# Patient Record
Sex: Female | Born: 1970 | Race: White | Hispanic: No | Smoking: Never smoker
Health system: Southern US, Community
[De-identification: ages and names within clinical notes are randomized; demographics above are authoritative.]

## PROBLEM LIST (undated history)

## (undated) DIAGNOSIS — C539 Malignant neoplasm of cervix uteri, unspecified: Secondary | ICD-10-CM

## (undated) DIAGNOSIS — F329 Major depressive disorder, single episode, unspecified: Secondary | ICD-10-CM

## (undated) DIAGNOSIS — E079 Disorder of thyroid, unspecified: Secondary | ICD-10-CM

---

## 2017-02-13 ENCOUNTER — Emergency Department (HOSPITAL_COMMUNITY)
Admission: EM | Admit: 2017-02-13 | Discharge: 2017-02-13 | Disposition: A | Discharge status: Home / Self Care | Payer: Medicaid Other | Attending: Emergency Medicine | Admitting: Emergency Medicine

## 2017-02-13 ENCOUNTER — Emergency Department (HOSPITAL_COMMUNITY): Payer: Medicaid Other

## 2017-02-13 ENCOUNTER — Encounter (HOSPITAL_COMMUNITY): Payer: Self-pay

## 2017-02-13 DIAGNOSIS — F419 Anxiety disorder, unspecified: Secondary | ICD-10-CM | POA: Insufficient documentation

## 2017-02-13 DIAGNOSIS — F410 Panic disorder [episodic paroxysmal anxiety] without agoraphobia: Secondary | ICD-10-CM | POA: Diagnosis present

## 2017-02-13 DIAGNOSIS — R0789 Other chest pain: Secondary | ICD-10-CM | POA: Diagnosis not present

## 2017-02-13 DIAGNOSIS — Z8541 Personal history of malignant neoplasm of cervix uteri: Secondary | ICD-10-CM | POA: Diagnosis not present

## 2017-02-13 DIAGNOSIS — R079 Chest pain, unspecified: Secondary | ICD-10-CM

## 2017-02-13 HISTORY — DX: Major depressive disorder, single episode, unspecified: F32.9

## 2017-02-13 HISTORY — DX: Malignant neoplasm of cervix uteri, unspecified: C53.9

## 2017-02-13 HISTORY — DX: Disorder of thyroid, unspecified: E07.9

## 2017-02-13 LAB — URINALYSIS, ROUTINE W REFLEX MICROSCOPIC
Bilirubin Urine: NEGATIVE
Glucose, UA: 150 mg/dL — AB
Hgb urine dipstick: NEGATIVE
KETONES UR: 80 mg/dL — AB
LEUKOCYTES UA: NEGATIVE
NITRITE: NEGATIVE
PH: 5 (ref 5.0–8.0)
Protein, ur: NEGATIVE mg/dL
SPECIFIC GRAVITY, URINE: 1.017 (ref 1.005–1.030)

## 2017-02-13 LAB — BASIC METABOLIC PANEL
ANION GAP: 24 — AB (ref 5–15)
ANION GAP: 14 (ref 5–15)
BUN: 6 mg/dL (ref 6–20)
BUN: 5 mg/dL — ABNORMAL LOW (ref 6–20)
CHLORIDE: 104 mmol/L (ref 101–111)
CO2: 13 mmol/L — ABNORMAL LOW (ref 22–32)
CO2: 18 mmol/L — AB (ref 22–32)
Calcium: 8.6 mg/dL — ABNORMAL LOW (ref 8.9–10.3)
Calcium: 7.4 mg/dL — ABNORMAL LOW (ref 8.9–10.3)
Chloride: 106 mmol/L (ref 101–111)
Creatinine, Ser: 0.79 mg/dL (ref 0.44–1.00)
Creatinine, Ser: 0.71 mg/dL (ref 0.44–1.00)
GFR calc Af Amer: >60 mL/min (ref >60)
GFR calc non Af Amer: >60 mL/min (ref >60)
GLUCOSE: 89 mg/dL (ref 65–99)
Glucose, Bld: 170 mg/dL — ABNORMAL HIGH (ref 65–99)
Potassium: 4.4 mmol/L (ref 3.5–5.1)
Potassium: 3.9 mmol/L (ref 3.5–5.1)
SODIUM: 138 mmol/L (ref 135–145)
Sodium: 141 mmol/L (ref 135–145)

## 2017-02-13 LAB — CBC WITH DIFFERENTIAL/PLATELET
BASOS ABS: 0 10*3/uL (ref 0.0–0.1)
Basophils Relative: 0 %
Eosinophils Absolute: 0 10*3/uL (ref 0.0–0.7)
Eosinophils Relative: 0 %
HCT: 38.5 % (ref 36.0–46.0)
HEMOGLOBIN: 12.6 g/dL (ref 12.0–15.0)
LYMPHS PCT: 9 %
Lymphs Abs: 0.7 10*3/uL (ref 0.7–4.0)
MCH: 35.4 pg — AB (ref 26.0–34.0)
MCHC: 32.7 g/dL (ref 30.0–36.0)
MCV: 108.1 fL — ABNORMAL HIGH (ref 78.0–100.0)
Monocytes Absolute: 0.1 10*3/uL (ref 0.1–1.0)
Monocytes Relative: 2 %
NEUTROS ABS: 6.8 10*3/uL (ref 1.7–7.7)
NEUTROS PCT: 89 %
Platelets: 150 10*3/uL (ref 150–400)
RBC: 3.56 MIL/uL — AB (ref 3.87–5.11)
RDW: 14.5 % (ref 11.5–15.5)
WBC: 7.7 10*3/uL (ref 4.0–10.5)

## 2017-02-13 LAB — I-STAT TROPONIN, ED: TROPONIN I, POC: 0 ng/mL (ref 0.00–0.08)

## 2017-02-13 MED ORDER — SODIUM CHLORIDE 0.9 % IV BOLUS (SEPSIS)
1000.0000 mL | Freq: Once | INTRAVENOUS | Status: AC
  Administered 2017-02-13: 1000 mL via INTRAVENOUS

## 2017-02-13 MED ORDER — LORAZEPAM 2 MG/ML IJ SOLN
0.5000 mg | Freq: Once | INTRAMUSCULAR | Status: AC
  Administered 2017-02-13: 0.5 mg via INTRAVENOUS
  Filled 2017-02-13: qty 1

## 2017-02-13 MED ORDER — ACETAMINOPHEN 325 MG PO TABS
650.0000 mg | ORAL_TABLET | Freq: Once | ORAL | Status: AC
  Administered 2017-02-13: 650 mg via ORAL
  Filled 2017-02-13: qty 2

## 2017-02-13 MED ORDER — ONDANSETRON HCL 4 MG/2ML IJ SOLN
4.0000 mg | Freq: Once | INTRAMUSCULAR | Status: AC
  Administered 2017-02-13: 4 mg via INTRAVENOUS
  Filled 2017-02-13: qty 2

## 2017-02-13 NOTE — ED Provider Notes (Signed)
[facility] DEPT Provider Note   CSN: [id number] Arrival date & time: 02/13/17  1645     History   Chief Complaint Chief Complaint  Patient presents with  . Panic Attack    HPI Meredith Lowe is a 46 y.o. female.  The history is provided by the patient and medical records. No language interpreter was used.   Meredith Lowe is a 46 y.o. female with PMH as listed below who presents to the Emergency Department complaining of "anxiety attack" which began today. 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 She is followed by OBGYN and recently had an abnormal pap smear. Samples were taken and she has been awaiting phone call with results. She missed a call on Friday from OBGYN where a voicemail was left on phone saying she had abnormal results from biopsy and to call back. Unfortunately, she was in training and unable to have her phone with her for the day, therefore missed call and did not get to call back before the office closed. She states that all weekend long, she was thinking about her diagnosis and that she may not live. She called a park that she enjoys and asked if she could spread her ashes there. She thought about who would receive her valuables, etc. This morning, she received a phone call from the clinic stating that two areas were "pre-cancerous" and the third biopsy was "stage 1". She states this was actually good news for her as she can undergo cone biopsy and her healthcare team felt confident that this was treatable. This morning at training, she felt "jittery" and her hands were shaking. She went throughout the morning feeling this way. She has hx of panic attacks and thought this was why she felt this way. Around 3pm today, she began feeling central chest discomfort. Non-radiating. Described as pressure. Began hyperventilating when chest started hurting. No fevers, chills, recent illness. No medications taken prior to arrival. No personal or  family cardiac history. No HTN, HLD, heart disease or DM.    Past Medical History:  Diagnosis Date  . Cervical cancer ([place])   . MDD (major depressive disorder)   . Thyroid disease     There are no active problems to display for this patient.   History reviewed. No pertinent surgical history.  OB History    No data available       Home Medications    Prior to Admission medications   Medication Sig Start Date End Date Taking? Authorizing Provider  EPINEPHrine 0.3 mg/0.3 mL IJ SOAJ injection Inject 0.3 mg into the muscle as needed (allergic reaction).   Yes [provider]  FLUoxetine (PROZAC) 10 MG capsule Take 10 mg by mouth daily. 01/27/17  Yes [provider]  folic acid (FOLVITE) 1 MG tablet Take 1 mg by mouth daily. 01/27/17  Yes [provider]  levothyroxine (SYNTHROID, LEVOTHROID) 100 MCG tablet Take 100 mcg by mouth daily before breakfast.   Yes [provider]  thiamine (VITAMIN B-1) 100 MG tablet Take 100 mg by mouth daily. 01/27/17  Yes [provider]  potassium chloride SA (K-DUR,KLOR-CON) 20 MEQ tablet Take 20 mEq by mouth daily. 01/27/17   [provider]    Family History History reviewed. No pertinent family history.  Social History Social History  Substance Use Topics  . Smoking status: Never Smoker  . Smokeless tobacco: Never Used  . Alcohol use No     Allergies   Iodine and Shellfish  allergy   Review of Systems Review of Systems  Respiratory: Positive for shortness of breath. Negative for cough.   Cardiovascular: Positive for chest pain.  Psychiatric/Behavioral: Negative for self-injury and suicidal ideas. The patient is nervous/anxious.   All other systems reviewed and are negative.    Physical Exam Updated Vital Signs BP 111/82   Pulse (!) 109   Temp 99 F (37.2 C) (Oral)   Resp 12   SpO2 100%   Physical Exam  Constitutional: She is oriented to person, place, and time. She  appears well-developed and well-nourished. No distress.  HENT:  Head: Normocephalic and atraumatic.  Cardiovascular: Normal rate, regular rhythm and normal heart sounds.   No murmur heard. Pulmonary/Chest: Effort normal and breath sounds normal. No respiratory distress. She has no wheezes. She has no rales.  Abdominal: Soft. She exhibits no distension. There is no tenderness.  Musculoskeletal: She exhibits no edema.  Neurological: She is alert and oriented to person, place, and time.  Skin: Skin is warm and dry.  Nursing note and vitals reviewed.    ED Treatments / Results  Labs (all labs ordered are listed, but only abnormal results are displayed) Labs Reviewed  CBC WITH DIFFERENTIAL/PLATELET - Abnormal; Notable for the following:       Result Value   RBC 3.56 (*)    MCV 108.1 (*)    MCH 35.4 (*)    All other components within normal limits  BASIC METABOLIC PANEL - Abnormal; Notable for the following:    CO2 13 (*)    Calcium 8.6 (*)    Anion gap 24 (*)    All other components within normal limits  URINALYSIS, ROUTINE W REFLEX MICROSCOPIC - Abnormal; Notable for the following:    Glucose, UA 150 (*)    Ketones, ur 80 (*)    All other components within normal limits  BASIC METABOLIC PANEL - Abnormal; Notable for the following:    CO2 18 (*)    Glucose, Bld 170 (*)    BUN 5 (*)    Calcium 7.4 (*)    All other components within normal limits  I-STAT TROPOININ, ED    EKG  EKG Interpretation  Date/Time:  Monday February 13 2017 18:06:49 EDT Ventricular Rate:  91 PR Interval:    QRS Duration: 78 QT Interval:  351 QTC Calculation: 432 R Axis:   110 Text Interpretation:  Sinus rhythm Right axis deviation Low voltage, extremity and precordial leads Probable anteroseptal infarct, old no prior EKG  Confirmed by Brantley Stage [phone]) on 02/13/2017 6:48:17 PM       Radiology Dg Chest 2 View  Result Date: 02/13/2017 CLINICAL DATA:  Chest pain.  Shortness of breath. EXAM: CHEST  2  VIEW COMPARISON:  None. FINDINGS: The heart size and mediastinal contours are within normal limits. Both lungs are clear. The visualized skeletal structures are unremarkable. IMPRESSION: Normal chest. Electronically Signed   By: Lorriane Shire M.D.   On: 02/13/2017 18:28    Procedures Procedures (including critical care time)  Medications Ordered in ED Medications  ondansetron (ZOFRAN) injection 4 mg (4 mg Intravenous Given 02/13/17 1739)  LORazepam (ATIVAN) injection 0.5 mg (0.5 mg Intravenous Given 02/13/17 1736)  acetaminophen (TYLENOL) tablet 650 mg (650 mg Oral Given 02/13/17 1943)  sodium chloride 0.9 % bolus 1,000 mL (0 mLs Intravenous Stopped 02/13/17 2032)     Initial Impression / Assessment and Plan / ED Course  I have reviewed the triage vital signs and the nursing  notes.  Pertinent labs & imaging results that were available during my care of the patient were reviewed by me and considered in my medical decision making (see chart for details).    Evalyne Cortopassi is a 46 y.o. female who presents to ED for "panic attack" with central chest pain. Patient has history of panic attacks in the past, but notes today was much worse than usual. She did have several episodes of emesis prior to arrival which has never happened before. She was recently informed that she has cervical cancer and notes that she has been very upset processing all of the information. She also states "a lot of other things going on in my life too" that have been stressful, including a new job. Will obtain EKG, CXR, troponin, cbc, bmp. Will also give zofran and ativan.  Labs reviewed with attending, Dr. Oleta Mouse. Trop negative and cbc reassuring. BMP with co2 of 13 and anion gap of 24. Denies ETOH use. No medication changes. Will give 1L IVF, observe and repeat BMP.   7:00 PM - Patient reevaluated and updated on labs. She feels much improved following Ativan and Zofran. She would like something to drink, otherwise no complaints.  No emesis since medication administration. Updated patient on plan for 1 L fluids and repeat BMP.   UA with ketones. No signs of infection. BMP repeat after fluids shows AG closed to 14. CO2 improving. Patient again re-evaluated and with no complaints. She feels better and would like to go home. Spoke at length about reasons to return to ER. PCP follow up strongly encouraged. All questions answered.   Patient discussed with Dr. Oleta Mouse who agrees with treatment plan.    Final Clinical Impressions(s) / ED Diagnoses   Final diagnoses:  Chest pain  Anxiety    New Prescriptions Discharge Medication List as of 02/13/2017 10:26 PM       Ward, Ozella Almond, PA-C 02/14/17 0006    Forde Dandy, MD 02/14/17 1055

## 2017-02-13 NOTE — ED Notes (Signed)
Patient transported to X-ray 

## 2017-02-13 NOTE — ED Triage Notes (Signed)
Pt. Here for anxiety attack. 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 Pt. Hx of major depressive disorder. Pt. Recently had biopsy of her cervix and found out today she has cervical cancer. Pt. States anxiety was building all weekend. Pt takes prozac daily. Pt. Shaking  And tearful during triage. Pt. Also c/o nausea and unable to eat all weekend. Pt. Given 347ml normal saline en route.

## 2017-02-13 NOTE — Discharge Instructions (Signed)
It was my pleasure taking care of you today!   Increase hydration. Follow up with your doctor as soon as possible for further discussion of today's hospital visit.  Please return to ER if symptoms return or new symptoms develop.

## 2018-07-14 IMAGING — CR DG CHEST 2V
2 series · 2 of 2 positions shown · non-contrast
Comparison: None.

CLINICAL DATA: Chest pain.  Shortness of breath.

EXAM:
CHEST  2 VIEW

[chest lat]
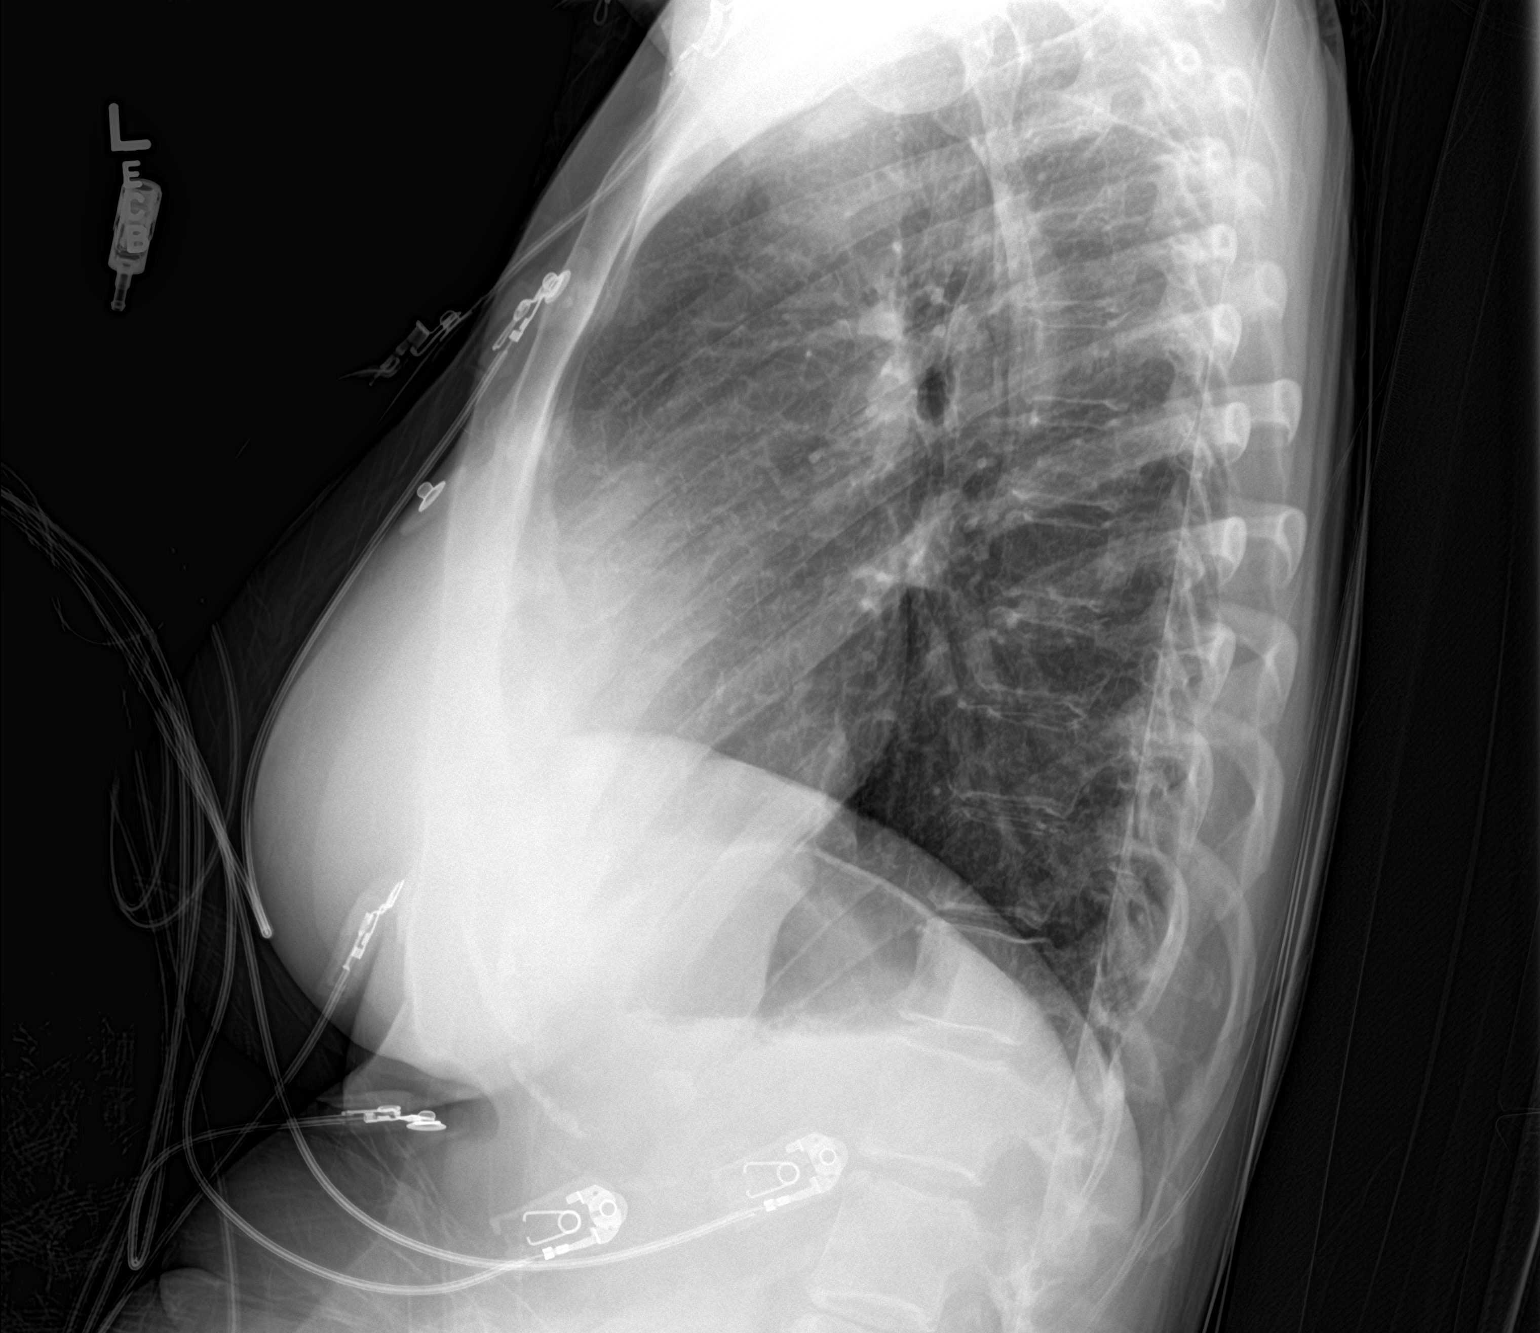

[chest ap]
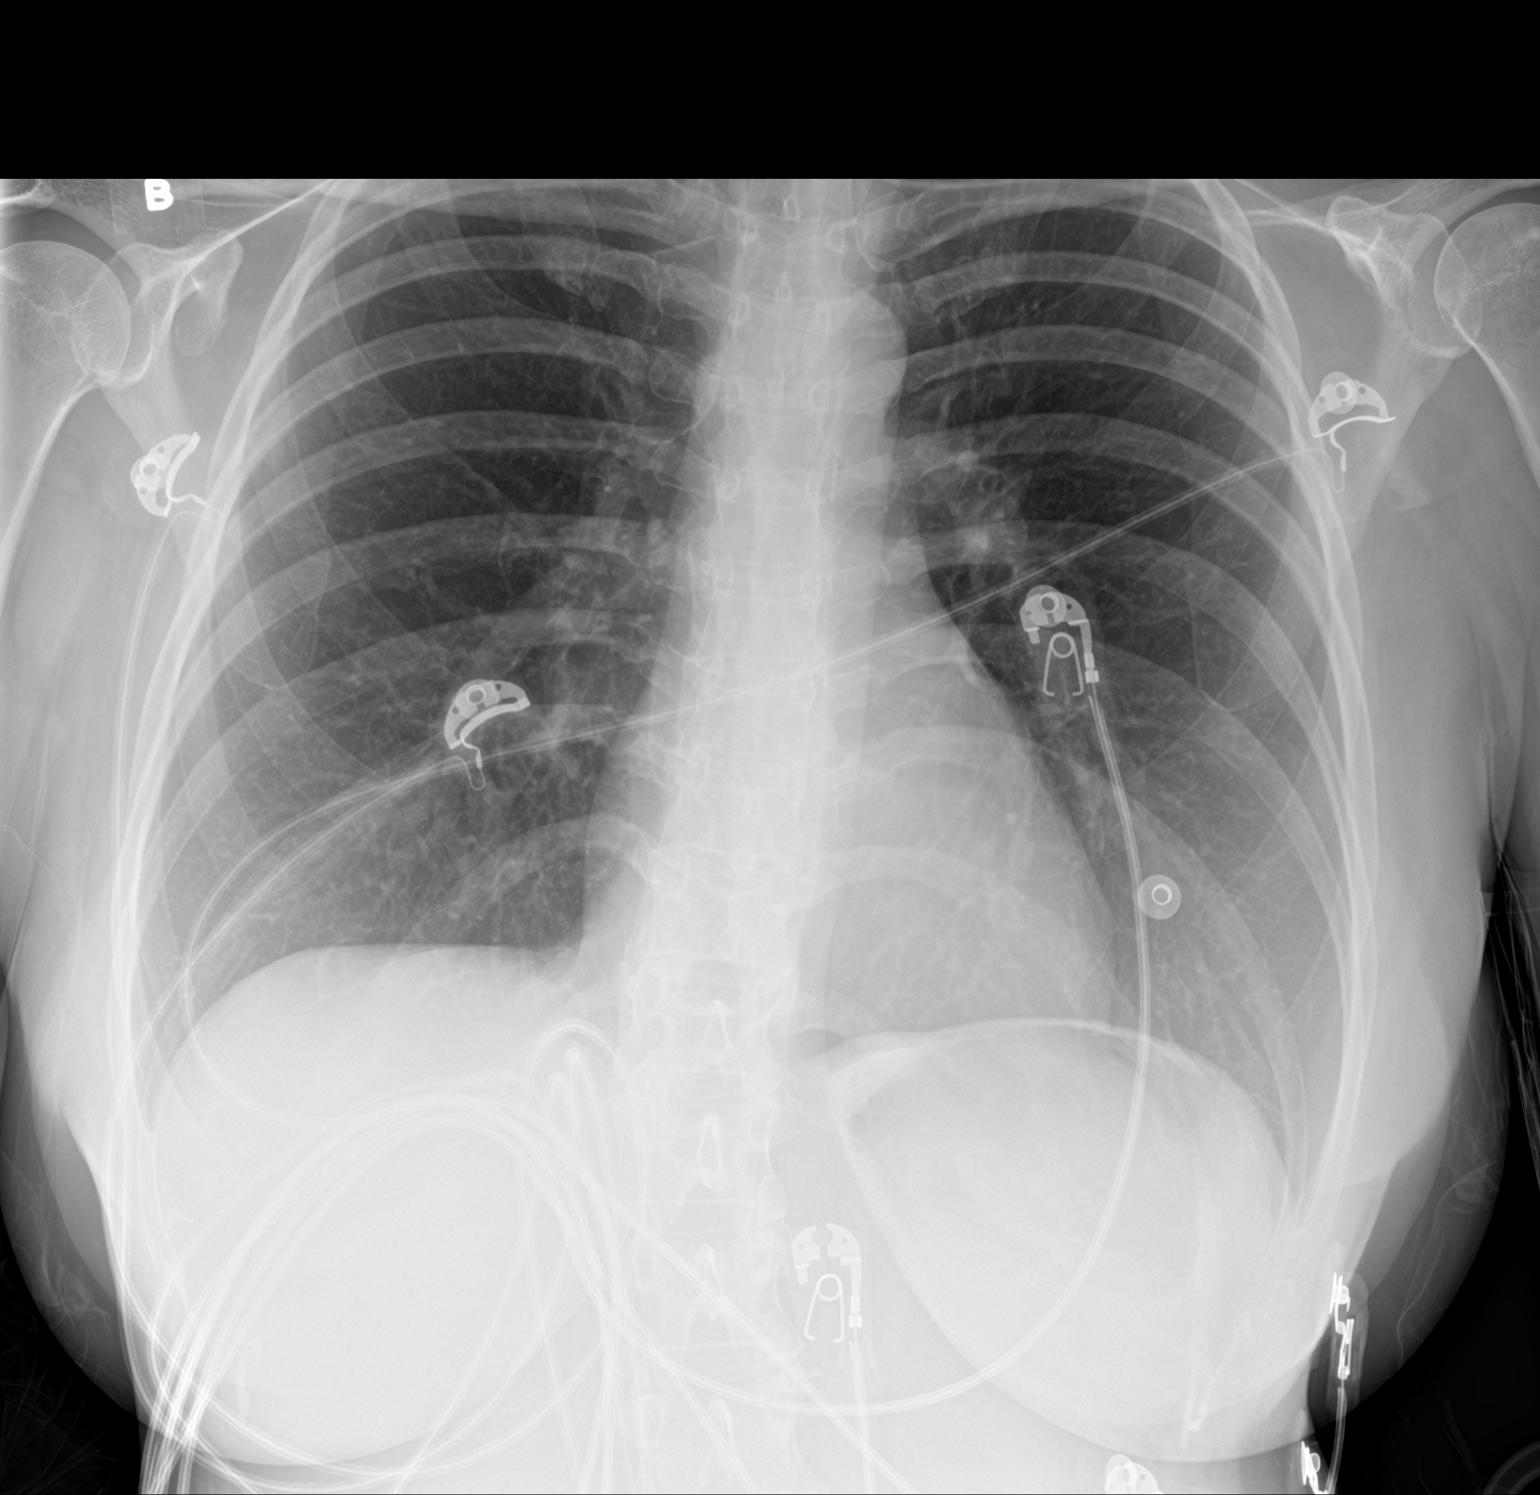

[2 of 2 positions shown; findings below may reference images not displayed]

FINDINGS: The heart size and mediastinal contours are within normal limits.
Both lungs are clear. The visualized skeletal structures are
unremarkable.
IMPRESSION: Normal chest.
